# Patient Record
Sex: Male | Born: 1995 | Race: Black or African American | Hispanic: No | Marital: Single | Smoking: Former smoker
Health system: Southern US, Community
[De-identification: ages and names within clinical notes are randomized; demographics above are authoritative.]

## PROBLEM LIST (undated history)

## (undated) DIAGNOSIS — R030 Elevated blood-pressure reading, without diagnosis of hypertension: Secondary | ICD-10-CM

## (undated) DIAGNOSIS — I421 Obstructive hypertrophic cardiomyopathy: Secondary | ICD-10-CM

---
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
## 2018-11-06 ENCOUNTER — Emergency Department (HOSPITAL_COMMUNITY)
Admission: EM | Admit: 2018-11-06 | Discharge: 2018-11-06 | Disposition: A | Discharge status: Home / Self Care | Payer: Self-pay | Attending: Emergency Medicine | Admitting: Emergency Medicine

## 2018-11-06 ENCOUNTER — Other Ambulatory Visit: Payer: Self-pay

## 2018-11-06 DIAGNOSIS — R202 Paresthesia of skin: Secondary | ICD-10-CM | POA: Insufficient documentation

## 2018-11-06 DIAGNOSIS — Z880 Allergy status to penicillin: Secondary | ICD-10-CM | POA: Insufficient documentation

## 2018-11-06 NOTE — ED Notes (Signed)
Patient verbalizes understanding of discharge instructions. Opportunity for questioning and answers were provided. Armband removed by staff, pt discharged from ED.  

## 2018-11-06 NOTE — ED Provider Notes (Signed)
[facility] EMERGENCY DEPARTMENT Provider Note   CSN: [id number] Arrival date & time: 11/06/18  2135    History   Chief Complaint Chief Complaint  Patient presents with  . Tingling in left hand    HPI Joseph Weiss is a 23 y.o. male is here for evaluation of tingling to the left middle and ring fingers.  Episode lasted approximately 5 minutes.  Currently asymptomatic.  He was doing his hair when he noticed the sensation.  No interventions.  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  He is right-hand dominant.  He denies any trauma.  He denies any distal loss of sensation or strength.  He denies associated proximal arm or neck pain, chest pain.  No associated redness, warmth.     HPI  No past medical history on file.  There are no active problems to display for this patient.   ** The histories are not reviewed yet. Please review them in the "History" navigator section and refresh this SmartLink.      Home Medications    Prior to Admission medications   Not on File    Family History No family history on file.  Social History Social History   Tobacco Use  . Smoking status: Not on file  Substance Use Topics  . Alcohol use: Not on file  . Drug use: Not on file     Allergies   Penicillins   Review of Systems Review of Systems  Neurological:       Paresthesias   All other systems reviewed and are negative.    Physical Exam Updated Vital Signs BP (!) 152/85 (BP Location: Right Arm)   Pulse 80   Temp 98.4 F (36.9 C) (Oral)   Resp 17   Ht 6\' 1"  (1.854 m)   Wt 108.9 kg   SpO2 98%   BMI 31.66 kg/m   Physical Exam Constitutional:      General: He is not in acute distress.    Appearance: He is well-developed.  HENT:     Head: Normocephalic and atraumatic.     Right Ear: External ear normal.     Left Ear: External ear normal.     Nose: Nose normal.  Eyes:     General: Lids are normal.  Neck:   Musculoskeletal: No muscular tenderness.     Thyroid: No thyromegaly.     Trachea: Trachea and phonation normal.     Comments: No muscular or midline tenderness. No pain with PROM of neck. Negative Spurling's. Negative Adson's. Cardiovascular:     Rate and Rhythm: Normal rate and regular rhythm.     Comments: 2+ radial pulses bilaterally. Good cap refill to fingers bilaterally.  Pulmonary:     Effort: Pulmonary effort is normal.     Breath sounds: Normal breath sounds.  Musculoskeletal:     Cervical back: He exhibits tenderness.     Comments: Shoulders: No focal tenderness to bony prominences of the left shoulder.  Full range of motion without pain.    Skin:    General: Skin is warm and dry.     Capillary Refill: Capillary refill takes less than 2 seconds.  Neurological:     Mental Status: He is alert and oriented to person, place, and time.     Comments: Sensation to light touch, sharp/dull intact in hands bilaterally 5/5 strength with hand grip and finger abduction bilaterally No tenderness to carpal tunnel. Negative phalen's and tinel's bilaterally.  Psychiatric:        Behavior: Behavior normal.        Thought Content: Thought content normal.      ED Treatments / Results  Labs (all labs ordered are listed, but only abnormal results are displayed) Labs Reviewed - No data to display  EKG None  Radiology No results found.  Procedures Procedures (including critical care time)  Medications Ordered in ED Medications - No data to display   Initial Impression / Assessment and Plan / ED Course  I have reviewed the triage vital signs and the nursing notes.  Pertinent labs & imaging results that were available during my care of the patient were reviewed by me and considered in my medical decision making (see chart for details).         23 yo with transient resolved paresthesias to left middle/ring fingers.  Given symptoms and exam I suspect carpal tunnel syndrome  versus other peripheral radiculopathy.  Exam is reassuring.  Cap refill, strength and sensation intact.  He has no associated neck pain.  Do not think there is indication for emergent imaging or lab work today.  He does repetitive hand movements at work and when playing the piano.  I will discharge with nighttime splint, rest, ice.  Return precautions were discussed.  He is comfortable with this.  Final Clinical Impressions(s) / ED Diagnoses   Final diagnoses:  Paresthesias    ED Discharge Orders    None       Jerrell Mylar 11/06/18 2246    Jacalyn Lefevre, MD 11/06/18 [phone]

## 2018-11-06 NOTE — Discharge Instructions (Addendum)
You were seen in the ER for tingling to left middle/ring fingers.   I think this is related to your median nerve or other hand nerve.  Wear splint at night. Ice your wrist. Rest.   Return for neck pain, loss of strength or sensation to extremity

## 2018-11-06 NOTE — ED Triage Notes (Signed)
Pt c/o tingling sensation in left hand, pt noticed when he was playing with his hair tonight.

## 2018-11-07 ENCOUNTER — Emergency Department (HOSPITAL_COMMUNITY): Payer: Self-pay

## 2018-11-07 ENCOUNTER — Other Ambulatory Visit: Payer: Self-pay

## 2018-11-07 ENCOUNTER — Emergency Department (HOSPITAL_COMMUNITY)
Admission: EM | Admit: 2018-11-07 | Discharge: 2018-11-07 | Disposition: A | Discharge status: Home / Self Care | Payer: Self-pay | Attending: Emergency Medicine | Admitting: Emergency Medicine

## 2018-11-07 ENCOUNTER — Encounter (HOSPITAL_COMMUNITY): Payer: Self-pay | Admitting: Emergency Medicine

## 2018-11-07 DIAGNOSIS — R0789 Other chest pain: Secondary | ICD-10-CM | POA: Insufficient documentation

## 2018-11-07 LAB — URINALYSIS, ROUTINE W REFLEX MICROSCOPIC
Bilirubin Urine: NEGATIVE
Glucose, UA: NEGATIVE mg/dL
Hgb urine dipstick: NEGATIVE
Ketones, ur: NEGATIVE mg/dL
Leukocytes,Ua: NEGATIVE
Nitrite: NEGATIVE
PROTEIN: NEGATIVE mg/dL
Specific Gravity, Urine: 1.017 (ref 1.005–1.030)
pH: 7 (ref 5.0–8.0)

## 2018-11-07 LAB — CBC WITH DIFFERENTIAL/PLATELET
Abs Immature Granulocytes: 0.03 10*3/uL (ref 0.00–0.07)
Basophils Absolute: 0 10*3/uL (ref 0.0–0.1)
Basophils Relative: 0 %
EOS ABS: 0.1 10*3/uL (ref 0.0–0.5)
Eosinophils Relative: 2 %
HCT: 50 % (ref 39.0–52.0)
Hemoglobin: 16.1 g/dL (ref 13.0–17.0)
Immature Granulocytes: 0 %
Lymphocytes Relative: 19 %
Lymphs Abs: 1.8 10*3/uL (ref 0.7–4.0)
MCH: 28.5 pg (ref 26.0–34.0)
MCHC: 32.2 g/dL (ref 30.0–36.0)
MCV: 88.5 fL (ref 80.0–100.0)
Monocytes Absolute: 0.8 10*3/uL (ref 0.1–1.0)
Monocytes Relative: 8 %
Neutro Abs: 6.8 10*3/uL (ref 1.7–7.7)
Neutrophils Relative %: 71 %
PLATELETS: 249 10*3/uL (ref 150–400)
RBC: 5.65 MIL/uL (ref 4.22–5.81)
RDW: 12 % (ref 11.5–15.5)
WBC: 9.5 10*3/uL (ref 4.0–10.5)
nRBC: 0 % (ref 0.0–0.2)

## 2018-11-07 LAB — COMPREHENSIVE METABOLIC PANEL
ALT: 61 U/L — ABNORMAL HIGH (ref 0–44)
AST: 43 U/L — ABNORMAL HIGH (ref 15–41)
Albumin: 4.6 g/dL (ref 3.5–5.0)
Alkaline Phosphatase: 78 U/L (ref 38–126)
Anion gap: 8 (ref 5–15)
BUN: 8 mg/dL (ref 6–20)
CO2: 27 mmol/L (ref 22–32)
Calcium: 10.2 mg/dL (ref 8.9–10.3)
Chloride: 103 mmol/L (ref 98–111)
Creatinine, Ser: 1.19 mg/dL (ref 0.61–1.24)
GFR calc Af Amer: >60 mL/min (ref >60)
GFR calc non Af Amer: >60 mL/min (ref >60)
Glucose, Bld: 105 mg/dL — ABNORMAL HIGH (ref 70–99)
POTASSIUM: 3.9 mmol/L (ref 3.5–5.1)
Sodium: 138 mmol/L (ref 135–145)
Total Bilirubin: 0.8 mg/dL (ref 0.3–1.2)
Total Protein: 8.2 g/dL — ABNORMAL HIGH (ref 6.5–8.1)

## 2018-11-07 LAB — TROPONIN I

## 2018-11-07 LAB — RAPID URINE DRUG SCREEN, HOSP PERFORMED
Amphetamines: NOT DETECTED
Barbiturates: NOT DETECTED
Benzodiazepines: NOT DETECTED
Cocaine: NOT DETECTED
Opiates: NOT DETECTED
Tetrahydrocannabinol: POSITIVE — AB

## 2018-11-07 LAB — LIPASE, BLOOD: LIPASE: 23 U/L (ref 11–51)

## 2018-11-07 LAB — PROTIME-INR
INR: 1.05
Prothrombin Time: 13.6 seconds (ref 11.4–15.2)

## 2018-11-07 LAB — I-STAT TROPONIN, ED: Troponin i, poc: 0 ng/mL (ref 0.00–0.08)

## 2018-11-07 MED ORDER — ASPIRIN 81 MG PO CHEW
324.0000 mg | CHEWABLE_TABLET | Freq: Once | ORAL | Status: AC
  Administered 2018-11-07: 324 mg via ORAL
  Filled 2018-11-07: qty 4

## 2018-11-07 MED ORDER — NITROGLYCERIN 2 % TD OINT
1.0000 [in_us] | TOPICAL_OINTMENT | Freq: Four times a day (QID) | TRANSDERMAL | Status: DC
  Administered 2018-11-07: 1 [in_us] via TOPICAL
  Filled 2018-11-07: qty 1

## 2018-11-07 NOTE — ED Provider Notes (Signed)
[facility] EMERGENCY DEPARTMENT Provider Note   CSN: [id number] Arrival date & time: 11/07/18  1255    History   Chief Complaint Chief Complaint  Patient presents with  . Chest Pain    HPI Joseph Weiss is a 23 y.o. male.     HPI Patient reports that at about 1130 he developed some chest pain.  He reports really it was not even a pain but an odd sensation.  He reports that just felt like he got a warmth in the center of his chest that lasted for about 45 minutes.  He denies he had shortness of breath with it.  He did not feel nauseated or vomit.  No sweats or lightheadedness.  After about 45 minutes it is resolved.  Patient reports the only thing about it was he was seen yesterday in the emergency department any had pain and tingling numbness sensation on the left hand.  He reports it was involving his fourth and fifth finger and it was reproducible when they put him in a position that stretched his elbows and then it caused tingling in his fourth and fifth fingers.  Patient denies any history of hypertension.  He denies he is been told that he needs to take medications.  He does not have any history of diabetes or cholesterol.  He reports his mother did have a heart attack in her 43s.  He reports however she also had a lot of problems with drugs that contributed to her poor health.  No other family members with early onset cardiac disease or sudden death.  Patient reports he does smoke about a half a pack of cigarettes per day.  He denies any use of IV drugs, cocaine, stimulant drugs. History reviewed. No pertinent past medical history.  There are no active problems to display for this patient.   Denies any significant medical history.      Home Medications    Prior to Admission medications   Not on File    Family History History reviewed. No pertinent family history.  Social History Social History   Tobacco Use  . Smoking status: Not on file    Substance Use Topics  . Alcohol use: Not on file  . Drug use: Not on file     Allergies   Penicillins   Review of Systems Review of Systems 10 Systems reviewed and are negative for acute change except as noted in the HPI.  Physical Exam Updated Vital Signs BP 121/60   Pulse 77   Temp 98.8 F (37.1 C) (Oral)   Resp 20   Ht 6\' 1"  (1.854 m)   Wt 109.2 kg   SpO2 99%   BMI 31.76 kg/m   Physical Exam Constitutional:      Appearance: He is well-developed.  HENT:     Head: Normocephalic and atraumatic.  Eyes:     Pupils: Pupils are equal, round, and reactive to light.  Neck:     Musculoskeletal: Neck supple.  Cardiovascular:     Rate and Rhythm: Normal rate and regular rhythm.     Heart sounds: Normal heart sounds.  Pulmonary:     Effort: Pulmonary effort is normal.     Breath sounds: Normal breath sounds.  Abdominal:     General: Bowel sounds are normal. There is no distension.     Palpations: Abdomen is soft.     Tenderness: There is no abdominal tenderness.  Musculoskeletal: Normal range of motion.  General: No swelling or tenderness.     Right lower leg: No edema.     Left lower leg: No edema.  Skin:    General: Skin is warm and dry.  Neurological:     Mental Status: He is alert and oriented to person, place, and time.     GCS: GCS eye subscore is 4. GCS verbal subscore is 5. GCS motor subscore is 6.     Coordination: Coordination normal.      ED Treatments / Results  Labs (all labs ordered are listed, but only abnormal results are displayed) Labs Reviewed  COMPREHENSIVE METABOLIC PANEL - Abnormal; Notable for the following components:      Result Value   Glucose, Bld 105 (*)    Total Protein 8.2 (*)    AST 43 (*)    ALT 61 (*)    All other components within normal limits  RAPID URINE DRUG SCREEN, HOSP PERFORMED - Abnormal; Notable for the following components:   Tetrahydrocannabinol POSITIVE (*)    All other components within normal limits   LIPASE, BLOOD  CBC WITH DIFFERENTIAL/PLATELET  PROTIME-INR  URINALYSIS, ROUTINE W REFLEX MICROSCOPIC  TROPONIN I  I-STAT TROPONIN, ED    EKG EKG Interpretation  Date/Time:  Friday November 07 2018 13:16:19 EST Ventricular Rate:  72 PR Interval:    QRS Duration: 73 QT Interval:  356 QTC Calculation: 390 R Axis:   64 Text Interpretation:  Sinus rhythm diffuse t wave inversion. no old comparison. Confirmed by Arby BarrettePfeiffer, Advit Trethewey [id number]) on 11/07/2018 1:25:06 PM   Radiology Dg Chest 2 View  Result Date: 11/07/2018 CLINICAL DATA:  Chest tightness. EXAM: CHEST - 2 VIEW COMPARISON:  None. FINDINGS: The heart size and mediastinal contours are within normal limits. Both lungs are clear. The visualized skeletal structures are unremarkable. IMPRESSION: Normal chest x-ray. Electronically Signed   By: Rudie MeyerP.  Gallerani M.D.   On: 11/07/2018 14:16    Procedures Procedures (including critical care time)  Medications Ordered in ED Medications  nitroGLYCERIN (NITROGLYN) 2 % ointment 1 inch (1 inch Topical Given 11/07/18 1422)  aspirin chewable tablet 324 mg (324 mg Oral Given 11/07/18 1401)     Initial Impression / Assessment and Plan / ED Course  I have reviewed the triage vital signs and the nursing notes.  Pertinent labs & imaging results that were available during my care of the patient were reviewed by me and considered in my medical decision making (see chart for details).  Clinical Course as of Nov 08 1631  Fri Nov 07, 2018  1341 Consult: Reviewed to Dr. Swazil[place]Jordan.  He has reviewed EKG not STEMI pattern.  We will continue evaluation for chest pain.   [MP]    Clinical Course User Index [MP] Arby BarrettePfeiffer, Ravinder Hofland, MD       Patient presents with atypical chest pain.  He reports it completely and spontaneously resolved after about 45 minutes.  He was asymptomatic upon my evaluation.  Patient had EKG with repolarization abnormality.  I did had this reviewed by cardiology which agreed this is  repolarization abnormality no STEMI pattern.  Patient does not have PE risk factors.  No procedures, no long travel, no lower extremity swelling.  At this time, will obtain 2 sets of troponins and if negative patient is counseled on outpatient follow-up.  Final Clinical Impressions(s) / ED Diagnoses   Final diagnoses:  Atypical chest pain    ED Discharge Orders    None       Dama Hedgepeth,  Lebron Conners, MD 11/07/18 [phone]

## 2018-11-07 NOTE — ED Notes (Signed)
Patient verbalizes understanding of discharge instructions. Opportunity for questioning and answers were provided. 

## 2018-11-07 NOTE — Discharge Instructions (Signed)
1.  Take an over-the-counter stomach acid medication such as Prilosec or Nexium for the next 2 weeks. 2.  You need a follow-up appointment with a family doctor.  Use the referral number in your discharge instructions to help find one. 3.  Return to the emergency department if you have recurrence of pain or other associated concerning symptoms.

## 2018-11-07 NOTE — ED Triage Notes (Signed)
Patient states chest tightness and warmth in the center of his chest that started around 11:30 am. Patient has a history of heartburn but says it feels different.

## 2018-11-07 NOTE — ED Provider Notes (Signed)
Assumed care from Dr. Donnald Garre at 5:50 PM. Briefly, the patient is a 23 y.o. male with PMHx of  has no past medical history on file. here with atypical CP. EKG with LVH, no ischemic changes. Plan for delta trop, d/c if neg.   Labs Reviewed  COMPREHENSIVE METABOLIC PANEL - Abnormal; Notable for the following components:      Result Value   Glucose, Bld 105 (*)    Total Protein 8.2 (*)    AST 43 (*)    ALT 61 (*)    All other components within normal limits  RAPID URINE DRUG SCREEN, HOSP PERFORMED - Abnormal; Notable for the following components:   Tetrahydrocannabinol POSITIVE (*)    All other components within normal limits  LIPASE, BLOOD  CBC WITH DIFFERENTIAL/PLATELET  PROTIME-INR  URINALYSIS, ROUTINE W REFLEX MICROSCOPIC  TROPONIN I  I-STAT TROPONIN, ED    Course of Care: Delta trop neg. Pt is asymptomatic here. Plan for outpt follow-up per Dr. Donnald Garre.     Shaune Pollack, MD 11/07/18 1750

## 2018-11-10 ENCOUNTER — Emergency Department (HOSPITAL_COMMUNITY)
Admission: EM | Admit: 2018-11-10 | Discharge: 2018-11-11 | Disposition: A | Discharge status: Home / Self Care | Payer: Self-pay | Attending: Emergency Medicine | Admitting: Emergency Medicine

## 2018-11-10 ENCOUNTER — Other Ambulatory Visit: Payer: Self-pay

## 2018-11-10 ENCOUNTER — Emergency Department (HOSPITAL_COMMUNITY): Payer: Self-pay

## 2018-11-10 ENCOUNTER — Encounter (HOSPITAL_COMMUNITY): Payer: Self-pay | Admitting: Emergency Medicine

## 2018-11-10 ENCOUNTER — Encounter (HOSPITAL_COMMUNITY): Payer: Self-pay

## 2018-11-10 ENCOUNTER — Emergency Department (HOSPITAL_COMMUNITY)
Admission: EM | Admit: 2018-11-10 | Discharge: 2018-11-10 | Disposition: A | Discharge status: Home / Self Care | Payer: Self-pay | Attending: Emergency Medicine | Admitting: Emergency Medicine

## 2018-11-10 DIAGNOSIS — Z87891 Personal history of nicotine dependence: Secondary | ICD-10-CM | POA: Insufficient documentation

## 2018-11-10 DIAGNOSIS — R0602 Shortness of breath: Secondary | ICD-10-CM | POA: Insufficient documentation

## 2018-11-10 DIAGNOSIS — R0789 Other chest pain: Secondary | ICD-10-CM | POA: Insufficient documentation

## 2018-11-10 DIAGNOSIS — M546 Pain in thoracic spine: Secondary | ICD-10-CM | POA: Insufficient documentation

## 2018-11-10 DIAGNOSIS — Z8241 Family history of sudden cardiac death: Secondary | ICD-10-CM | POA: Insufficient documentation

## 2018-11-10 LAB — I-STAT TROPONIN, ED: Troponin i, poc: 0 ng/mL (ref 0.00–0.08)

## 2018-11-10 LAB — CBC
HCT: 46.1 % (ref 39.0–52.0)
HEMOGLOBIN: 14.9 g/dL (ref 13.0–17.0)
MCH: 28.6 pg (ref 26.0–34.0)
MCHC: 32.3 g/dL (ref 30.0–36.0)
MCV: 88.5 fL (ref 80.0–100.0)
Platelets: 250 10*3/uL (ref 150–400)
RBC: 5.21 MIL/uL (ref 4.22–5.81)
RDW: 12 % (ref 11.5–15.5)
WBC: 10.8 10*3/uL — ABNORMAL HIGH (ref 4.0–10.5)
nRBC: 0 % (ref 0.0–0.2)

## 2018-11-10 MED ORDER — METHOCARBAMOL 500 MG PO TABS: 500.0000 mg | ORAL_TABLET | Freq: Two times a day (BID) | ORAL | 0 refills | Status: DC

## 2018-11-10 MED ORDER — SODIUM CHLORIDE 0.9% FLUSH: 3.0000 mL | Freq: Once | INTRAVENOUS | Status: DC

## 2018-11-10 MED ORDER — IBUPROFEN 400 MG PO TABS
600.0000 mg | ORAL_TABLET | Freq: Once | ORAL | Status: AC
  Administered 2018-11-10: 600 mg via ORAL
  Filled 2018-11-10: qty 1

## 2018-11-10 NOTE — ED Triage Notes (Signed)
Pt here for reevaluation for on and off chest pain.  States he has been seen here for chest tightness prior.  States he wants to have an echocardiogram done.  No increase in pain but pain does persist even when taking pain medication.

## 2018-11-10 NOTE — ED Provider Notes (Signed)
[facility] EMERGENCY DEPARTMENT Provider Note   CSN: [id number] Arrival date & time: 11/10/18  0737    History   Chief Complaint Chief Complaint  Patient presents with  . Back Pain    HPI Joseph Weiss is a 23 y.o. male.     HPI   23 year old male presents today with complaints of left-sided back pain.  Patient notes symptoms started yesterday with a vague pain in his left lateral thoracic back.  He notes waking up with more pain this morning.  He notes this is worse when he moves his upper torso.  Occasionally worse with inspiration, no shortness of breath history DVT or any other significant risk factors.  No trauma to his back.  No medications prior to arrival.  No fever distal neurological deficits.  History reviewed. No pertinent past medical history.  There are no active problems to display for this patient.   History reviewed. No pertinent surgical history.      Home Medications    Prior to Admission medications   Not on File    Family History No family history on file.  Social History Social History   Tobacco Use  . Smoking status: Not on file  Substance Use Topics  . Alcohol use: Not on file  . Drug use: Not on file     Allergies   Penicillins   Review of Systems Review of Systems  All other systems reviewed and are negative.    Physical Exam Updated Vital Signs BP (!) 143/78 (BP Location: Left Arm)   Pulse 90   Temp 98.1 F (36.7 C) (Oral)   Resp 20   Ht 6\' 1"  (1.854 m)   Wt 106.6 kg   SpO2 100%   BMI 31.00 kg/m   Physical Exam Vitals signs and nursing note reviewed.  Constitutional:      Appearance: He is well-developed.  HENT:     Head: Normocephalic and atraumatic.  Eyes:     General: No scleral icterus.       Right eye: No discharge.        Left eye: No discharge.     Conjunctiva/sclera: Conjunctivae normal.     Pupils: Pupils are equal, round, and reactive to light.  Neck:   Musculoskeletal: Normal range of motion.     Vascular: No JVD.     Trachea: No tracheal deviation.  Pulmonary:     Effort: No respiratory distress.     Breath sounds: Normal breath sounds. No stridor. No wheezing, rhonchi or rales.  Musculoskeletal:     Comments: Minimal tenderness palpation of the left lateral thoracic musculature, no rashes, no midline tenderness  Neurological:     Mental Status: He is alert and oriented to person, place, and time.     Coordination: Coordination normal.  Psychiatric:        Behavior: Behavior normal.        Thought Content: Thought content normal.        Judgment: Judgment normal.      ED Treatments / Results  Labs (all labs ordered are listed, but only abnormal results are displayed) Labs Reviewed - No data to display  EKG None  Radiology No results found.  Procedures Procedures (including critical care time)  Medications Ordered in ED Medications - No data to display   Initial Impression / Assessment and Plan / ED Course  I have reviewed the triage vital signs and the nursing notes.  Pertinent labs & imaging  results that were available during my care of the patient were reviewed by me and considered in my medical decision making (see chart for details).        23 year old male presents today with likely musculoskeletal pain.  Discharged with symptomatic care strict return precautions.  Verbalized understanding and agreement to today's plan.  Very low suspicion for any pulmonary etiology.  Final Clinical Impressions(s) / ED Diagnoses   Final diagnoses:  Acute left-sided thoracic back pain    ED Discharge Orders    None       Rosalio Loud 11/10/18 5631    Benjiman Core, MD 11/10/18 [phone]

## 2018-11-10 NOTE — Discharge Instructions (Addendum)
Please read attached information. If you experience any new or worsening signs or symptoms please return to the emergency room for evaluation. Please follow-up with your primary care provider or specialist as discussed. Please use medication prescribed only as directed and discontinue taking if you have any concerning signs or symptoms.   °

## 2018-11-10 NOTE — ED Triage Notes (Signed)
PT reports left back pain beginning last night which radiates around his left side. Pt denies any falls.

## 2018-11-11 LAB — BASIC METABOLIC PANEL
Anion gap: 9 (ref 5–15)
BUN: 10 mg/dL (ref 6–20)
CO2: 26 mmol/L (ref 22–32)
Calcium: 9.4 mg/dL (ref 8.9–10.3)
Chloride: 104 mmol/L (ref 98–111)
Creatinine, Ser: 1.09 mg/dL (ref 0.61–1.24)
GFR calc Af Amer: >60 mL/min (ref >60)
GLUCOSE: 91 mg/dL (ref 70–99)
Potassium: 4.4 mmol/L (ref 3.5–5.1)
Sodium: 139 mmol/L (ref 135–145)

## 2018-11-11 LAB — HEPATIC FUNCTION PANEL
ALT: 41 U/L (ref 0–44)
AST: 32 U/L (ref 15–41)
Albumin: 4.1 g/dL (ref 3.5–5.0)
Alkaline Phosphatase: 72 U/L (ref 38–126)
BILIRUBIN DIRECT: 0.1 mg/dL (ref 0.0–0.2)
BILIRUBIN TOTAL: 0.6 mg/dL (ref 0.3–1.2)
Indirect Bilirubin: 0.5 mg/dL (ref 0.3–0.9)
Total Protein: 7.5 g/dL (ref 6.5–8.1)

## 2018-11-11 LAB — D-DIMER, QUANTITATIVE: D-Dimer, Quant: <0.27 ug/mL-FEU (ref 0.00–0.50)

## 2018-11-11 LAB — I-STAT TROPONIN, ED: Troponin i, poc: 0 ng/mL (ref 0.00–0.08)

## 2018-11-11 LAB — LIPASE, BLOOD: Lipase: 30 U/L (ref 11–51)

## 2018-11-11 NOTE — ED Notes (Signed)
E-signature not available, verbalized understanding of DC instructions and followup care with cardiology.

## 2018-11-11 NOTE — Discharge Instructions (Addendum)
There is no evidence of heart attack or blood clot in the lung.  Establish care with a primary care doctor see you can have an echocardiogram.  Stop smoking.  Return to the ED if your chest pain becomes exertional, associated with shortness of breath, sweating, nausea, vomiting or other concerns.

## 2018-11-11 NOTE — ED Provider Notes (Signed)
[facility] EMERGENCY DEPARTMENT Provider Note   CSN: [id number] Arrival date & time: 11/10/18  2303    History   Chief Complaint Chief Complaint  Patient presents with  . Chest Pain    HPI Joseph Weiss is a 23 y.o. male.     Patient presents with pain in his center of his chest that came on while he was resting earlier this evening in the car.  He stated he began to feel a tightness rated up to his neck that was associated with a feeling of anxiousness and thinks he was little bit sweaty.  There is some associated shortness of breath.  No nausea, vomiting, syncope or abdominal pain. Patient's chest pain has since resolved and he estimates it lasts only for a couple minutes.  There is no abdominal pain, nausea or vomiting.  Patient was seen earlier in the ED for back spasms and given Robaxin.  He reports the back spasms were not happening at the time of his chest pain.  He was seen in the ED in February 21 for similar chest pain and found to have early repolarization on his EKG with a negative delta troponin. Patient states the pain he had tonight is similar to the pain he experienced on February 21.  States he has not experienced any episodes of chest pain from that until now.  He admits to marijuana and tobacco use but no cocaine use.  States his mother had a heart attack in her 16s that was fatal but she also had problems with drugs. Patient does not have a doctor and does not take any chronic medications.  The history is provided by the patient and a friend.  Chest Pain  Associated symptoms: shortness of breath   Associated symptoms: no abdominal pain, no dizziness, no headache, no nausea, no vomiting and no weakness     History reviewed. No pertinent past medical history.  There are no active problems to display for this patient.   History reviewed. No pertinent surgical history.      Home Medications    Prior to Admission medications     Medication Sig Start Date End Date Taking? Authorizing Provider  methocarbamol (ROBAXIN) 500 MG tablet Take 1 tablet (500 mg total) by mouth 2 (two) times daily. 11/10/18   Eyvonne Mechanic, PA-C    Family History History reviewed. No pertinent family history.  Social History Social History   Tobacco Use  . Smoking status: Former Games developer  . Smokeless tobacco: Never Used  Substance Use Topics  . Alcohol use: Yes  . Drug use: Not on file     Allergies   Penicillins   Review of Systems Review of Systems  Constitutional: Negative for activity change and appetite change.  HENT: Negative for congestion and rhinorrhea.   Eyes: Negative for visual disturbance.  Respiratory: Positive for chest tightness and shortness of breath.   Cardiovascular: Positive for chest pain.  Gastrointestinal: Negative for abdominal pain, nausea and vomiting.  Genitourinary: Negative for dysuria and hematuria.  Musculoskeletal: Negative for arthralgias and myalgias.  Skin: Negative for rash.  Neurological: Negative for dizziness, weakness and headaches.    all other systems are negative except as noted in the HPI and PMH.    Physical Exam Updated Vital Signs BP (!) 150/84   Pulse 80   Temp 98.3 F (36.8 C) (Oral)   Resp 18   SpO2 99%   Physical Exam Vitals signs and nursing note reviewed.  Constitutional:  General: He is not in acute distress.    Appearance: He is well-developed.     Comments: Mildly anxious appearing  HENT:     Head: Normocephalic and atraumatic.     Mouth/Throat:     Pharynx: No oropharyngeal exudate.  Eyes:     Conjunctiva/sclera: Conjunctivae normal.     Pupils: Pupils are equal, round, and reactive to light.  Neck:     Musculoskeletal: Normal range of motion and neck supple.     Comments: No meningismus. Cardiovascular:     Rate and Rhythm: Normal rate and regular rhythm.     Heart sounds: Normal heart sounds. No murmur.  Pulmonary:     Effort: Pulmonary  effort is normal. No respiratory distress.     Breath sounds: Normal breath sounds.  Chest:     Chest wall: No tenderness.  Abdominal:     Palpations: Abdomen is soft.     Tenderness: There is no abdominal tenderness. There is no guarding or rebound.  Musculoskeletal: Normal range of motion.        General: No tenderness.  Skin:    General: Skin is warm.     Capillary Refill: Capillary refill takes less than 2 seconds.  Neurological:     General: No focal deficit present.     Mental Status: He is alert and oriented to person, place, and time.     Cranial Nerves: No cranial nerve deficit.     Motor: No abnormal muscle tone.     Coordination: Coordination normal.     Comments: No ataxia on finger to nose bilaterally. No pronator drift. 5/5 strength throughout. CN 2-12 intact.Equal grip strength. Sensation intact.   Psychiatric:        Behavior: Behavior normal.      ED Treatments / Results  Labs (all labs ordered are listed, but only abnormal results are displayed) Labs Reviewed  CBC - Abnormal; Notable for the following components:      Result Value   WBC 10.8 (*)    All other components within normal limits  BASIC METABOLIC PANEL  D-DIMER, QUANTITATIVE (NOT AT [facility])  HEPATIC FUNCTION PANEL  LIPASE, BLOOD  I-STAT TROPONIN, ED  I-STAT TROPONIN, ED    EKG EKG Interpretation  Date/Time:  Monday November 10 2018 23:08:47 EST Ventricular Rate:  67 PR Interval:  150 QRS Duration: 92 QT Interval:  366 QTC Calculation: 386 R Axis:   68 Text Interpretation:  Normal sinus rhythm Nonspecific ST and T wave abnormality Abnormal ECG T wave changes less pronounced than previous Confirmed by Glynn Octave [phone]) on 11/11/2018 2:20:31 AM   Radiology Dg Chest 2 View  Result Date: 11/10/2018 CLINICAL DATA:  Chest pain EXAM: CHEST - 2 VIEW COMPARISON:  11/07/2018 FINDINGS: Heart and mediastinal contours are within normal limits. No focal opacities or effusions. No acute bony  abnormality. IMPRESSION: No active cardiopulmonary disease. Electronically Signed   By: Charlett Nose M.D.   On: 11/10/2018 23:46    Procedures Procedures (including critical care time)  Medications Ordered in ED Medications  sodium chloride flush (NS) 0.9 % injection 3 mL (has no administration in time range)     Initial Impression / Assessment and Plan / ED Course  I have reviewed the triage vital signs and the nursing notes.  Pertinent labs & imaging results that were available during my care of the patient were reviewed by me and considered in my medical decision making (see chart for details).  Episode of chest tightness that has since resolved.  Similar to episode he experienced earlier this week.  His EKG shows unchanged T wave inversions without acute ischemia.  Patient states his chest pain has resolved.  Declines pain medication. Troponin negative, chest x-ray negative.  Low suspicion for ACS. D-dimer is negative and second troponin is negative as well.  Doubt ACS, pulmonary embolism, aortic dissection. Equal upper extremity blood pressures, pulses and grip strengths.  Patient to follow-up with PCP as well as cardiology for echocardiogram given his EKG abnormalities.  Return precautions discussed including if pain becomes exertional, associated shortness of breath, nausea, vomiting, diaphoresis or other concerns.  Final Clinical Impressions(s) / ED Diagnoses   Final diagnoses:  Atypical chest pain    ED Discharge Orders    None       Dominick Morella, Jeannett Senior, MD 11/11/18 [phone]

## 2019-03-09 ENCOUNTER — Telehealth: Payer: Self-pay | Admitting: Internal Medicine

## 2019-03-09 NOTE — Telephone Encounter (Signed)
No mychart, smartphone, consent, pre reg complete 03/09/19 AF °

## 2019-03-09 NOTE — Telephone Encounter (Signed)
Follow up ° ° °Patient's wife is returning call. Please call. °

## 2019-03-12 NOTE — Telephone Encounter (Signed)
I called pt, there was no answer and no voicemail. °

## 2019-03-13 ENCOUNTER — Telehealth (INDEPENDENT_AMBULATORY_CARE_PROVIDER_SITE_OTHER): Payer: Self-pay | Admitting: Internal Medicine

## 2019-03-13 VITALS — Ht 73.0 in

## 2019-03-13 DIAGNOSIS — R9431 Abnormal electrocardiogram [ECG] [EKG]: Secondary | ICD-10-CM

## 2019-03-13 DIAGNOSIS — R079 Chest pain, unspecified: Secondary | ICD-10-CM

## 2019-03-13 DIAGNOSIS — Z01818 Encounter for other preprocedural examination: Secondary | ICD-10-CM

## 2019-03-13 MED ORDER — METOPROLOL TARTRATE 50 MG PO TABS: 100.0000 mg | ORAL_TABLET | Freq: Once | ORAL | 0 refills | Status: DC

## 2019-03-13 NOTE — Patient Instructions (Addendum)
Medication Instructions:   SEE INSTRUCTION FOR  CT CORONARY ANGIOGRAM   If you need a refill on your cardiac medications before your next appointment, please call your pharmacy.   Lab work:  SEE INSTRUCTION FOR  CT CORONARY ANGIOGRAM   If you have labs (blood work) drawn today and your tests are completely normal, you will receive your results only by: Marland Kitchen. MyChart Message (if you have MyChart) OR . A paper copy in the mail If you have any lab test that is abnormal or we need to change your treatment, we will call you to review the results.  Testing/Procedures: WILL BE SCHEDULE AT [address] Your physician has requested that you have an echocardiogram. Echocardiography is a painless test that uses sound waves to create images of your heart. It provides your doctor with information about the size and shape of your heart and how well your heart's chambers and valves are working. This procedure takes approximately one hour. There are no restrictions for this procedure.   WILL BE SCHEDULE AT [place] , ONCE PRIOR AUTHORIZATION IS OBTAIN FROM INSURANCE- Your physician has requested that you have cardiac CTA. Cardiac computed tomography (CT) is a painless test that uses an x-ray machine to take clear, detailed pictures of your heart. For further information please visit [email or web address]. Please follow instruction sheet as given.    Follow-Up: At [facility], you and your health needs are our priority.  As part of our continuing mission to provide you with exceptional heart care, we have created designated Provider Care Teams.  These Care Teams include your primary Cardiologist (physician) and Advanced Practice Providers (APPs -  Physician Assistants and Nurse Practitioners) who all work together to provide you with the care you need, when you need it. . You will need a follow up appointment in 1 months.    You may see Parke PoissonGayatri A Annali Lybrand, MD*or one of the  following Advanced Practice Providers on your designated Care Team:   . Theodore DemarkRhonda Barrett, PA-C . Joni ReiningKathryn Lawrence, DNP, ANP  Any Other Special Instructions Will Be Listed Below (If Applicable).     INSTRUCTIONS FOR  CORONARY CTA    Please [facility] the [facility] main entrance of [facility] at (30-45 minutes prior to test start time)  [facility]  [address] [place], [place] [address] [phone]  Proceed to the [facility] Radiology Department (First Floor).  Please follow these instructions carefully (unless otherwise directed):  PLEASE HAVE LABS - BMP  AT LEAST ONE WEEK PRIOR TO TEST  On the Night Before the Test: . Drink plenty of water. . Do not consume any caffeinated/decaffeinated beverages or chocolate 12 hours prior to your test. . Do not take any antihistamines 12 hours prior to your test.    On the Day of the Test: . Drink plenty of water. Do not drink any water within one hour of the test. . Do not eat any food 4 hours prior to the test. . You may take your regular medications prior to the test. . Take 100 mg of lopressor (metoprolol) two hour before the test.   After the Test: . Drink plenty of water. . After receiving IV contrast, you may experience a mild flushed feeling. This is normal. . On occasion, you may experience a mild rash up to 24 hours after the test. This is not dangerous. If this occurs, you can take Benadryl 25 mg and increase your fluid  intake. . If you experience trouble breathing, this can be serious. If it is severe call 911 IMMEDIATELY. If it is mild, please call our office.

## 2019-03-13 NOTE — Progress Notes (Signed)
Virtual Visit via Telephone Note   This visit type was conducted due to national recommendations for restrictions regarding the COVID-19 Pandemic (e.g. social distancing) in an effort to limit this patient's exposure and mitigate transmission in our community.  Due to his co-morbid illnesses, this patient is at least at moderate risk for complications without adequate follow up.  This format is felt to be most appropriate for this patient at this time.  The patient did not have access to video technology/had technical difficulties with video requiring transitioning to audio format only (telephone).  All issues noted in this document were discussed and addressed.  No physical exam could be performed with this format.  Please refer to the patient's chart for his  consent to telehealth for [facility].   Date:  03/17/2019   ID:  Joseph CoxHayward T Skeens, DOB 04/07/1996, MRN [id number]  Patient Location: Home Provider Location: Office  PCP:  Patient, No Pcp Per  Cardiologist:  Parke PoissonGayatri A Male Minish, MD  Electrophysiologist:  None   Evaluation Performed:  New Patient Evaluation  Chief Complaint:  Chest pain  History of Present Illness:    Joseph Weiss is a 23 y.o. male with no significant past medical history.   Chest pain - couple months duration, twirling hair and hand felt numb and then chest pain. Any given moment  It will occur. Will last for 20 minutes. Doesn't wake from sleep.  No palpitations with chest pain Endorses snoring.  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   The patient denies  dyspnea at rest or with exertion, palpitations, PND, orthopnea, or leg swelling. Denies syncope or presyncope. Denies dizziness or lightheadedness.   The patient does not have symptoms concerning for COVID-19 infection (fever, chills, cough, or new shortness of breath).     No past medical history on file. No past surgical history on file.   No outpatient medications have been marked as taking for the 03/13/19 encounter (Telemedicine) with Parke PoissonAcharya, Vinay Ertl A, MD.     Allergies:   Penicillins   Social History   Tobacco Use  . Smoking status: Former Games developermoker  . Smokeless tobacco: Never Used  Substance Use Topics  . Alcohol use: Yes  . Drug use: Not on file     Family Hx: The patient's mother had SCD from MI age 23 ROS:   Please see the history of present illness.     All other systems reviewed and are negative.   Prior CV studies:   The following studies were reviewed today:    Labs/Other Tests and Data Reviewed:    EKG:  No ECG reviewed.  Recent Labs: 11/10/2018: BUN 10; Creatinine, Ser 1.09; Hemoglobin 14.9; Platelets 250; Potassium 4.4; Sodium 139 11/11/2018: ALT 41   Recent Lipid Panel No results found for: CHOL, TRIG, HDL, CHOLHDL, LDLCALC, LDLDIRECT  Wt Readings from Last 3 Encounters:  11/10/18 235 lb (106.6 kg)  11/07/18 240 lb 12 oz (109.2 kg)  11/06/18 240 lb (108.9 kg)     Objective:    Vital Signs:  Ht 6\' 1"  (1.854 m)   BMI 31.00 kg/m    VITAL SIGNS:  reviewed GEN:  no acute distress  ASSESSMENT & PLAN:    1. Chest pain, moderate coronary artery risk   2. Abnormal EKG   3. Pre-op testing    Will obtain  CT coronary angiogram for chest pain and to rule out coronary anomalies. Will also obtain echocardiogram to rule out structural abnormalities.   Will follow up with patient after testing to determine if further steps required.   COVID-19 Education: The signs and symptoms of COVID-19 were discussed with the patient and how to seek care for testing (follow up with PCP or arrange E-visit).  The importance of social distancing was discussed today.  Time:   Today, I have spent 40 minutes with the patient with telehealth technology discussing the above problems.     Medication Adjustments/Labs and Tests  Ordered: Current medicines are reviewed at length with the patient today.  Concerns regarding medicines are outlined above.   Tests Ordered: Orders Placed This Encounter  Procedures  . CT CORONARY MORPH W/CTA COR W/SCORE W/CA W/CM &/OR WO/CM  . CT CORONARY FRACTIONAL FLOW RESERVE DATA PREP  . CT CORONARY FRACTIONAL FLOW RESERVE FLUID ANALYSIS  . Basic metabolic panel  . ECHOCARDIOGRAM COMPLETE    Medication Changes: Meds ordered this encounter  Medications  . metoprolol tartrate (LOPRESSOR) 50 MG tablet    Sig: Take 2 tablets (100 mg total) by mouth once for 1 dose. TAKE TWO HOUR PRIOR TO  SCHEDULE CARDIAC CTA TEST    Dispense:  2 tablet    Refill:  0    Follow Up:  Virtual Visit or In Person in 1 month(s)  Signed, Elouise Munroe, MD  03/13/2019 11:59 PM    [place]

## 2019-03-16 ENCOUNTER — Telehealth: Payer: Self-pay | Admitting: *Deleted

## 2019-03-16 NOTE — Telephone Encounter (Signed)
LEFT MESSAGE TO CALL BACK --NEED TO GO OVER INSTRUCTION FROM OFFICE APPT FROM 6/26

## 2019-03-18 ENCOUNTER — Telehealth: Payer: Self-pay | Admitting: Internal Medicine

## 2019-03-18 NOTE — Telephone Encounter (Signed)
Lvmom to call regarding ? ins and to schedule ct if self pay/saf

## 2019-03-24 ENCOUNTER — Other Ambulatory Visit: Payer: Self-pay

## 2019-03-24 ENCOUNTER — Ambulatory Visit (HOSPITAL_COMMUNITY): Payer: Self-pay | Attending: Cardiology

## 2019-03-24 DIAGNOSIS — R079 Chest pain, unspecified: Secondary | ICD-10-CM | POA: Insufficient documentation

## 2019-03-24 DIAGNOSIS — R9431 Abnormal electrocardiogram [ECG] [EKG]: Secondary | ICD-10-CM | POA: Insufficient documentation

## 2019-03-26 ENCOUNTER — Telehealth: Payer: Self-pay | Admitting: *Deleted

## 2019-03-26 DIAGNOSIS — I421 Obstructive hypertrophic cardiomyopathy: Secondary | ICD-10-CM

## 2019-03-26 NOTE — Telephone Encounter (Signed)
Left message to call patient in regards to  Echo.  NEED TO GIVE RESULTS  need to cancel CCTA , WILL SCHEDULE CARDIAC MRI

## 2019-03-26 NOTE — Telephone Encounter (Signed)
-----   Message from Elouise Munroe, MD sent at 03/25/2019  6:25 PM EDT ----- Echo shows possible increased wall thickness in the middle of left side of heart, which is creating an obstruction to flow when he strains himself. The area in question at the tip of the heart (the apex) was not well seen and the patient did not allow for use of echo microbubble contrast. I think we need to make sure he doesn't have hypertrophic cardiomyopathy specifically affecting the tip of the heart (the apex).   Please cancel CT coronary angiogram and schedule cardiac MRI to evaluate for hypertrophic cardiomyopathy, which I think will be the more high yield test. Please put in notes "query apical or reverse curve HCM, please call Dr. Margaretann Loveless for protocol."

## 2019-03-30 NOTE — Telephone Encounter (Signed)
Follow up:    Patient returning call back from last Thursday. Triage inform me to send message to you.

## 2019-03-31 NOTE — Telephone Encounter (Signed)
Spoke to patient aware of echo  Results and also cancelling CCTA AND SCHEDULING Cardiac MRI   AWARE SCHEDUELER WILL VIEN HOM ACALL ABOUT TIME A AND DATE

## 2019-03-31 NOTE — Telephone Encounter (Signed)
TEST CANCELLED , DUE CHANGING TO CARDIAC MRI PER DR Margaretann Loveless

## 2019-04-03 ENCOUNTER — Telehealth: Payer: Self-pay | Admitting: *Deleted

## 2019-04-03 ENCOUNTER — Encounter: Payer: Self-pay | Admitting: Internal Medicine

## 2019-04-03 NOTE — Telephone Encounter (Signed)
Patient aware of Cardiac MRI scheduled for Monday 04/20/19 at 11:00 am at [facility].  Will also mail information to patient.

## 2019-04-20 ENCOUNTER — Ambulatory Visit (HOSPITAL_COMMUNITY): Admission: RE | Admit: 2019-04-20 | Payer: BC Managed Care – PPO | Source: Ambulatory Visit

## 2019-05-01 ENCOUNTER — Telehealth: Payer: Self-pay | Admitting: *Deleted

## 2019-05-01 ENCOUNTER — Telehealth (HOSPITAL_COMMUNITY): Payer: Self-pay | Admitting: Emergency Medicine

## 2019-05-01 NOTE — Telephone Encounter (Signed)
Left VM on answering service stating that appt for 8/17 has been canceled due to no pre-auth on file.   Left message on voicemail with name and callback number Marchia Bond RN Navigator Cardiac [facility] [phone] Office [phone] Cell

## 2019-05-01 NOTE — Telephone Encounter (Signed)
Left message regarding Cardiac MRI scheduled for Monday 05/04/19 at 8:00 am--insurance authorization has not been received and we need to reschedule---message sent to precert and Bernestine Amass

## 2019-05-01 NOTE — Telephone Encounter (Signed)
Left message on voicemail with name and callback number Shante Archambeault RN Navigator Cardiac Imaging [facility] [phone] Office [phone] Cell  

## 2019-05-04 ENCOUNTER — Ambulatory Visit (HOSPITAL_COMMUNITY): Payer: BC Managed Care – PPO

## 2019-05-06 ENCOUNTER — Encounter: Payer: Self-pay | Admitting: *Deleted

## 2019-05-06 NOTE — Telephone Encounter (Signed)
Left message regarding Cardiac MRI appointment schedule 05/22/19 at 8:00 am--will also mail instructoin letter to patient

## 2019-05-21 ENCOUNTER — Telehealth (HOSPITAL_COMMUNITY): Payer: Self-pay | Admitting: Emergency Medicine

## 2019-05-21 NOTE — Telephone Encounter (Signed)
Reaching out to patient to offer assistance regarding upcoming cardiac imaging study; pt verbalizes understanding of appt date/time, parking situation and where to check in, and verified current allergies; name and call back number provided for further questions should they arise Holly Pring RN Navigator Cardiac Imaging [facility] [phone] office [phone] cell 

## 2019-05-22 ENCOUNTER — Ambulatory Visit (HOSPITAL_COMMUNITY)
Admission: RE | Admit: 2019-05-22 | Discharge: 2019-05-22 | Disposition: A | Discharge status: Home / Self Care | Payer: BC Managed Care – PPO | Source: Ambulatory Visit | Attending: Internal Medicine | Admitting: Internal Medicine

## 2019-05-22 ENCOUNTER — Other Ambulatory Visit: Payer: Self-pay

## 2019-05-22 DIAGNOSIS — I421 Obstructive hypertrophic cardiomyopathy: Secondary | ICD-10-CM | POA: Insufficient documentation

## 2019-05-22 MED ORDER — GADOBUTROL 1 MMOL/ML IV SOLN
10.0000 mL | Freq: Once | INTRAVENOUS | Status: AC | PRN
  Administered 2019-05-22: 10:00:00 10 mL via INTRAVENOUS

## 2019-06-01 ENCOUNTER — Other Ambulatory Visit: Payer: Self-pay

## 2019-06-01 ENCOUNTER — Ambulatory Visit (INDEPENDENT_AMBULATORY_CARE_PROVIDER_SITE_OTHER): Payer: BC Managed Care – PPO | Admitting: Cardiovascular Disease

## 2019-06-01 ENCOUNTER — Encounter: Payer: Self-pay | Admitting: Cardiovascular Disease

## 2019-06-01 VITALS — BP 152/92 | HR 82 | Ht 73.0 in | Wt 341.0 lb

## 2019-06-01 DIAGNOSIS — I422 Other hypertrophic cardiomyopathy: Secondary | ICD-10-CM | POA: Diagnosis not present

## 2019-06-01 DIAGNOSIS — R030 Elevated blood-pressure reading, without diagnosis of hypertension: Secondary | ICD-10-CM

## 2019-06-01 DIAGNOSIS — I421 Obstructive hypertrophic cardiomyopathy: Secondary | ICD-10-CM

## 2019-06-01 HISTORY — DX: Elevated blood-pressure reading, without diagnosis of hypertension: R03.0

## 2019-06-01 HISTORY — DX: Morbid (severe) obesity due to excess calories: E66.01

## 2019-06-01 HISTORY — DX: Obstructive hypertrophic cardiomyopathy: I42.1

## 2019-06-01 MED ORDER — BISOPROLOL FUMARATE 5 MG PO TABS: ORAL_TABLET | ORAL | 5 refills | Status: AC

## 2019-06-01 NOTE — Patient Instructions (Addendum)
Medication Instructions:  START BISOPROLOL 2.5 MG DAILY   If you need a refill on your cardiac medications before your next appointment, please call your pharmacy.   Lab work: NONE  If you have labs (blood work) drawn today and your tests are completely normal, you will receive your results only by: Marland Kitchen MyChart Message (if you have MyChart) OR . A paper copy in the mail If you have any lab test that is abnormal or we need to change your treatment, we will call you to review the results.  Testing/Procedures: Your physician has recommended that you wear a holter monitor. Holter monitors are medical devices that record the heart's electrical activity. Doctors most often use these monitors to diagnose arrhythmias. Arrhythmias are problems with the speed or rhythm of the heartbeat. The monitor is a small, portable device. You can wear one while you do your normal daily activities. This is usually used to diagnose what is causing palpitations/syncope (passing out). 3 DAYS   Follow-Up: Your physician recommends that you schedule a follow-up appointment in: [place]   You have been referred to [place].

## 2019-06-01 NOTE — Progress Notes (Signed)
Cardiology Office Note   Date:  06/01/2019   ID:  Joseph Weiss, DOB 11/13/1995, MRN [id number]  PCP:  Patient, No Pcp Per  Cardiologist:   Chilton Siiffany [place], MD   No chief complaint on file.    History of Present Illness: Joseph Weiss is a 23 y.o. male who presents for follow-up.  He has been experiencing intermittent chest pain for the last few months.  It happens once or twice a day.  The discomfort is not severe.  Sometimes it happens at rest but seems to be worse when he is active.  It lasts for 10 or 20 minutes.  It is associated with some mild shortness of breath but no nausea or diaphoresis.  He denies any lower extremity, orthopnea, or PND.  He sometimes feels as though his heart skips beats.  This last for a second or 2.  He has no history of syncope or presyncope.  Of note, his mother died suddenly at age 23.  He relates she had a heart attack but he was only 9 and does not know the details.  He notes that she did have a drug problem at the time.   Joseph Weiss initially saw Dr. Priscille KluverAchyarya 02/2019 for chest pain.  His symptoms were atypical.  However EKG showed inferolateral T wave inversions.  He was referred for coronary CT-a and an echo.    Echo 03/24/2019 showed LVH greater than 65%.  There was concern for mid cavitary LV gradient that increased to 81 mmHg with Valsalva.  The apex was not well visualized.  It was recommended that the CT be canceled and he go for cardiac MRI instead.  He had a cardiac MRI on 05/22/2019.  LVEF was 68% and the right ventricular function was normal.  There was no evidence of late gadolinium enhancement.  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     Past Medical History:  Diagnosis Date  . Elevated blood pressure reading 06/01/2019  . Morbid obesity (HCC) 06/01/2019  . Obstructive hypertrophic cardiomyopathy (HCC) 06/01/2019    History  reviewed. No pertinent surgical history.   Current Outpatient Medications  Medication Sig Dispense Refill  . bisoprolol (ZEBETA) 5 MG tablet TAKE 1/2 TABLET DAILY 15 tablet 5   No current facility-administered medications for this visit.     Allergies:   Penicillins    Social History:  The patient  reports that he has quit smoking. He has never used smokeless tobacco. He reports current alcohol use.   Family History:  The patient's family history includes Drug abuse in his mother; Sudden Cardiac Death in his mother.    ROS:  Please see the history of present illness.   Otherwise, review of systems are positive for none.   All other systems are reviewed and negative.    PHYSICAL EXAM: VS:  BP (!) 152/92   Pulse 82   Ht 6\' 1"  (1.854 m)   Wt (!) 341 lb (154.7 kg)   SpO2 97%   BMI 44.99 kg/m  , BMI Body mass index is 44.99 kg/m. GENERAL:  Well appearing HEENT:  Pupils equal round and reactive, fundi not visualized, oral mucosa unremarkable NECK:  No jugular venous distention, waveform within normal limits, carotid upstroke brisk and symmetric, no bruits, no thyromegaly LYMPHATICS:  No cervical adenopathy LUNGS:  Clear to auscultation bilaterally HEART:  RRR.  PMI not displaced or sustained,S1 and  S2 within normal limits, no S3, no S4, no clicks, no rubs, no murmurs ABD:  Flat, positive bowel sounds normal in frequency in pitch, no bruits, no rebound, no guarding, no midline pulsatile mass, no hepatomegaly, no splenomegaly EXT:  2 plus pulses throughout, no edema, no cyanosis no clubbing SKIN:  No rashes no nodules NEURO:  Cranial nerves II through XII grossly intact, motor grossly intact throughout PSYCH:  Cognitively intact, oriented to person place and time    EKG:  EKG is ordered today. The ekg ordered 06/01/2019 demonstrates sinus rhythm.  Rate 82 bpm.  Inferolateral T wave inversions.  Unchanged from prior.  Echo 03/24/19: IMPRESSIONS    1. The left ventricle has  hyperdynamic systolic function, with an ejection fraction of >65%. The cavity size was normal. There is moderately increased left ventricular wall thickness. Left ventricular diastolic parameters were normal. There is minimal  mid-cavity LV gradient at rest, but with valsalva, the gradient rises to 81 mmHg. No regional wall motion abnormalities. Of note, the apex was not well-visualized on this study.  2. The right ventricle has normal systolic function. The cavity was normal. There is no increase in right ventricular wall thickness.  3. No evidence of mitral valve stenosis. No mitral regurgitation.  4. The aortic valve is tricuspid. No stenosis of the aortic valve.  5. The aortic root is normal in size and structure.  6. Normal IVC size. PA systolic pressure 28 mmHg.   Cardiac MRI 05/22/19:  IMPRESSION: 1. Normal left ventricular size, with moderate concentric hypertrophy and hyperdynamic systolic function (LVEF = 68%). There are no regional wall motion abnormalities. There is no late gadolinium enhancement in the left ventricular myocardium.  2. Normal right ventricular size, thickness and systolic function (LVEF = 48%). There are no regional wall motion abnormalities.  3.  Mild mitral and trivial tricuspid regurgitation.  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 There is no late gadolinium enhancement seen in the LV myocardium. Additional genetic testing is recommended.  Recent Labs: 11/10/2018: BUN 10; Creatinine, Ser 1.09; Hemoglobin 14.9; Platelets 250; Potassium 4.4; Sodium 139 11/11/2018: ALT 41    Lipid Panel No results found for: CHOL, TRIG, HDL, CHOLHDL, VLDL, LDLCALC, LDLDIRECT    Wt Readings from Last 3 Encounters:  06/01/19 (!) 341 lb (154.7 kg)  11/10/18 235 lb (106.6 kg)  11/07/18 240 lb 12 oz (109.2 kg)      ASSESSMENT AND PLAN:  #  Hypertrophic cardiomyopathy: Joseph Weiss had evidence of hypertrophic cardiomyopathy on his echo and cardiac MRI.  He had an intra-cavitary gradient of 81 mmHg with Valsalva.  The left ventricular posterior wall measured 1.6 cm on echo.  There is no evidence of gadolinium enhancement.  His mother died suddenly in her 20s.  He was told that she had a heart attack but the specifics around this are unclear.  She had a drug problem.  He does not remember if she ever had a cardiac stent or if this was actually sudden cardiac death.  He will inquire with his family.  We will get a 24-hour ambulatory monitor to assess for ventricular arrhythmias.  We will also start bisoprolol 2.5mg  daily after he finishes the monitor.  We are not doing ETTs due to COVID-19, but we will arrange when able.  We started the discussion about ICD.  He understands that we will make a decision together once we  have the above information.  We will refer him to the genetics clinic for further evaluation.  He recently had a child.  # Elevated BP: Blood pressure was initially very elevated.  This improved somewhat on repeat.  Typically his blood pressure is not high.  He is very stressed and anxious about this new diagnosis.  Continue to monitor for now.   Current medicines are reviewed at length with the patient today.  The patient does not have concerns regarding medicines.  The following changes have been made: Start bisoprolol  Labs/ tests ordered today include:   Orders Placed This Encounter  Procedures  . Ambulatory referral to Genetics  . LONG TERM MONITOR (3-14 DAYS)  . EKG 12-Lead     Disposition:   FU with Ladine Kiper C. Oval Linsey, MD, [facility] in 1 month     Signed, Pinky Ravan C. Oval Linsey, MD, [facility]  06/01/2019 2:48 PM    [place]

## 2019-06-04 ENCOUNTER — Telehealth: Payer: Self-pay

## 2019-06-04 NOTE — Telephone Encounter (Signed)
LM with pt with brief instructions on Zio 3 day monitor. Advised pt that it will be mailed to him today and that he can call back with any questions or concerns.

## 2019-07-03 ENCOUNTER — Ambulatory Visit: Payer: BC Managed Care – PPO | Admitting: Cardiovascular Disease

## 2020-02-13 IMAGING — MR MR CARD MORPHOLOGY WO/W CM
25 of 27 series · 38 of 40 positions shown · IV contrast (gadavist)
Comparison: none

CLINICAL DATA: 23-year-old male with an echocardiogram suspicious
for hypertrophic cardiomyopathy.

EXAM:
CARDIAC MRI
TECHNIQUE: The patient was scanned on a 1.5 Tesla GE magnet. A dedicated
cardiac coil was used. Functional imaging was done using Fiesta
sequences. [DATE], and 4 chamber views were done to assess for RWMA's.
Modified Bertranb rule using a short axis stack was used to
calculate an ejection fraction on a dedicated work station using
Circle software. The patient received 8 cc of Gadavist. After 10
minutes inversion recovery sequences were used to assess for
infiltration and scar tissue.
CONTRAST:  8 cc  of Gadavist

[Series 6: bSSFP · oblique · 8.0mm · 1.61mm/px · 13 of 425 slices shown (1 of 7)]
[im 1/425]
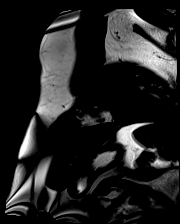
[im 36/425]
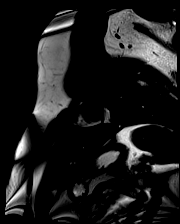
[im 71/425]
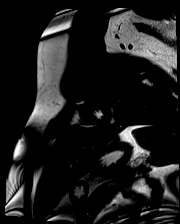
[im 107/425]
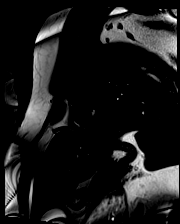
[im 142/425]
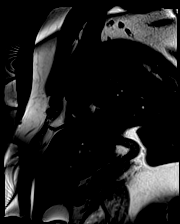
[im 177/425]
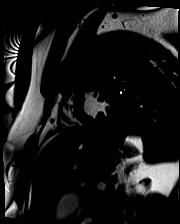
[im 213/425]
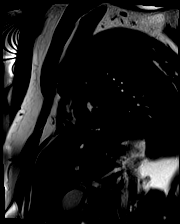
[im 248/425]
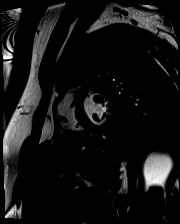
[im 283/425]
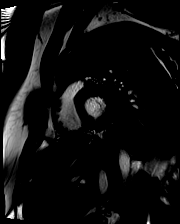
[im 319/425]
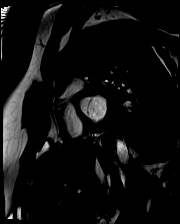
[im 354/425]
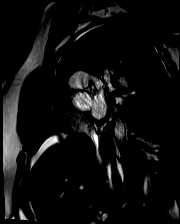
[im 389/425]
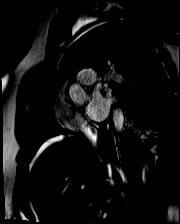
[im 425/425]
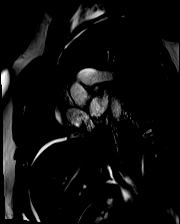

[Series 7: t2_stir_db_sax · oblique · 8.0mm · 1.73mm/px · 1 of 14 slices shown]
[im 1/14]
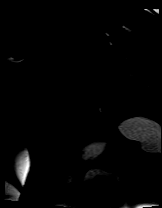

[Series 8: hla cine stack · axial · 6.0mm · 1.73mm/px · z∈[-193,-126]mm · 2 of 154 slices shown]
[im 1/154]
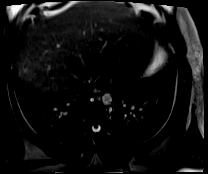
[im 154/154]
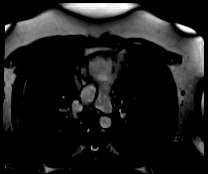

[Series 9: t2_stir_db_radial ((date)ch) · axial · 6.0mm · 1.73mm/px · 1 of 3 slices shown]
[im 1/3]
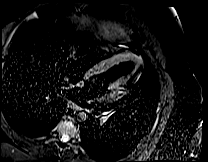

[Series 10: (id)_long_t1 · oblique · 8.0mm · 1.98mm/px · 1 of 24 slices shown (1 of 2)]
[im 1/24]
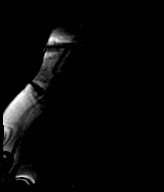

[Series 11: (id)_long_t1_moco · oblique · 8.0mm · 1.98mm/px · 1 of 24 slices shown (1 of 2)]
[im 1/24]
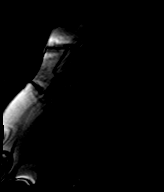

[Series 14: bSSFP · axial · 7.0mm · 1.41mm/px · 1 of 25 slices shown (2 of 7)]
[im 1/25]
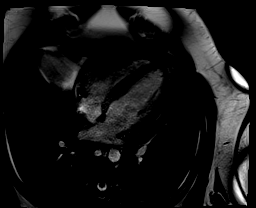

[Series 15: bSSFP · oblique · 7.0mm · 1.41mm/px · 1 of 25 slices shown (3 of 7)]
[im 1/25]
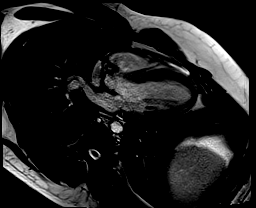

[Series 16: bSSFP · oblique · 7.0mm · 1.41mm/px · 1 of 25 slices shown (4 of 7)]
[im 1/25]
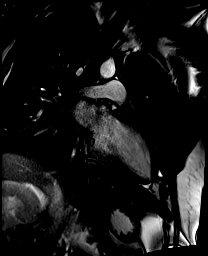

[Series 17: bSSFP · oblique · 7.0mm · 1.41mm/px · 1 of 25 slices shown (5 of 7)]
[im 1/25]
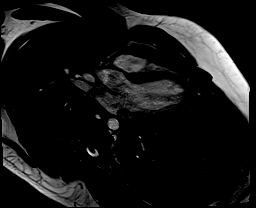

[Series 18: bSSFP · oblique · 7.0mm · 1.41mm/px · 1 of 25 slices shown (6 of 7)]
[im 1/25]
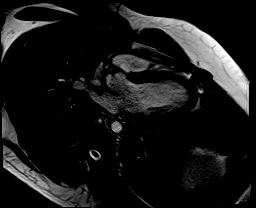

[Series 19: bSSFP · oblique · 7.0mm · 1.41mm/px · 1 of 25 slices shown (7 of 7)]
[im 1/25]
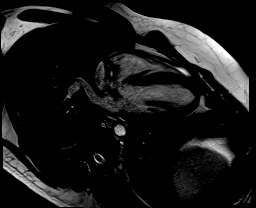

[Series 21: lge_single shot sa · oblique · 8.0mm · 1.98mm/px · 1 of 17 slices shown (1 of 2)]
[im 1/17]
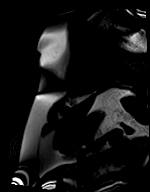

[Series 22: lge_single shot sa · oblique · 8.0mm · 1.98mm/px · 1 of 17 slices shown (2 of 2)]
[im 1/17]
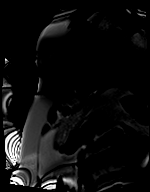

[Series 23: lge_single shot radial_mag · axial · 6.0mm · 1.98mm/px · 1 of 1 slices shown]
[im 1/1]
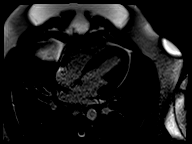

[Series 24: lge_single shot radial_psir · axial · 6.0mm · 1.98mm/px · 1 of 1 slices shown]
[im 1/1]
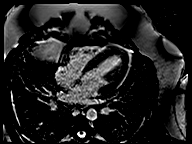

[Series 29: (id)_long_t1 · oblique · 8.0mm · 1.98mm/px · 1 of 24 slices shown (2 of 2)]
[im 1/24]
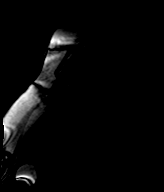

[Series 30: (id)_long_t1_moco · oblique · 8.0mm · 1.98mm/px · 1 of 24 slices shown (2 of 2)]
[im 1/24]
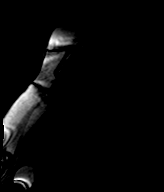

[Series 34: lge short axis_mag · oblique · 8.0mm · 1.61mm/px · 1 of 14 slices shown]
[im 1/14]
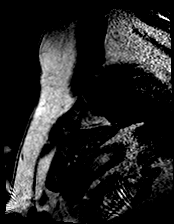

[Series 35: lge short axis_psir · oblique · 8.0mm · 1.61mm/px · 1 of 14 slices shown]
[im 1/14]
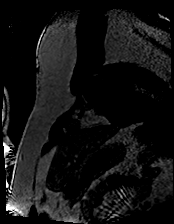

[Series 36: lge radial ((date)ch)_mag · axial · 6.0mm · 1.61mm/px · 1 of 1 slices shown]
[im 1/1]
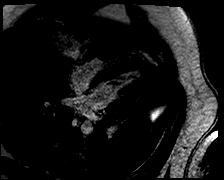

[Series 37: lge radial ((date)ch)_psir · axial · 6.0mm · 1.61mm/px · 1 of 1 slices shown]
[im 1/1]
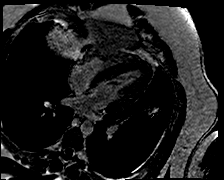

[Series 42: aorta pc flow_250_tp_retro_bh · oblique · 6.0mm · 1.73mm/px · 1 of 30 slices shown]
[im 1/30]
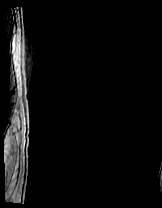

[Series 43: aorta pc flow_250_tp_retro_bh_mag · oblique · 6.0mm · 1.73mm/px · 1 of 30 slices shown]
[im 1/30]
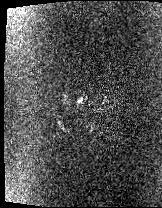

[Series 44: aorta pc flow_250_tp_retro_bh_p · oblique · 6.0mm · 1.73mm/px · 1 of 30 slices shown]
[im 1/30]
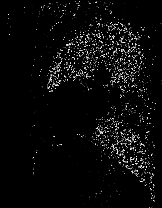

[38 of 40 positions shown; findings below may reference images not displayed]

FINDINGS: 1. Normal left ventricular size, with moderate concentric
hypertrophy and hyperdynamic systolic function (LVEF = 68%). There
are no regional wall motion abnormalities. There is no late
gadolinium enhancement in the left ventricular myocardium.

LVEDD: 49 mm

LVESD: 28 mm

LVEDV: 143 ml

LVESV: 45 ml

SV: 97 ml

CO: 10.2 L/min

Myocardial mass: 216 g

2. Normal right ventricular size, thickness and systolic function
(LVEF = 48%). There are no regional wall motion abnormalities.

3.  Normal left and right atrial size.

4. Normal size of the aortic root, ascending aorta and pulmonary
artery.

5.  Mild mitral and trivial tricuspid regurgitation.

6.  Normal pericardium.  No pericardial effusion.
IMPRESSION: 1. Normal left ventricular size, with moderate concentric
hypertrophy and hyperdynamic systolic function (LVEF = 68%). There
are no regional wall motion abnormalities. There is no late
gadolinium enhancement in the left ventricular myocardium.

2. Normal right ventricular size, thickness and systolic function
(LVEF = 48%). There are no regional wall motion abnormalities.

3.  Mild mitral and trivial tricuspid regurgitation.

These findings are suspicious for a reverse curve hypertrophic
cardiomyopathy with increased gradients in the mid cavity and near
cavity obliteration in systole. There is no late gadolinium
enhancement seen in the LV myocardium. Additional genetic testing is
recommended.

## 2021-10-26 ENCOUNTER — Encounter: Payer: Self-pay | Admitting: Neurology

## 2021-11-13 ENCOUNTER — Encounter: Payer: Self-pay | Admitting: Neurology

## 2021-11-13 ENCOUNTER — Ambulatory Visit (INDEPENDENT_AMBULATORY_CARE_PROVIDER_SITE_OTHER): Payer: 59 | Admitting: Neurology

## 2021-11-13 ENCOUNTER — Other Ambulatory Visit: Payer: 59

## 2021-11-13 ENCOUNTER — Other Ambulatory Visit: Payer: Self-pay

## 2021-11-13 VITALS — BP 151/94 | HR 76 | Resp 20 | Wt 365.0 lb

## 2021-11-13 DIAGNOSIS — R202 Paresthesia of skin: Secondary | ICD-10-CM

## 2021-11-13 NOTE — Progress Notes (Signed)
[facility] Clinic Note - Initial Visit   Date: 11/13/21  [name] MRN: [id number] DOB: 05-03-1996   Dear Joseph Bent, PA-C:  Thank you for your kind referral of Joseph Weiss for consultation of numbness/tingling of the hands and feet. Although his history is well known to you, please allow [place] to reiterate it for the purpose of our medical record. The patient was accompanied to the clinic by girlfriend who also provides collateral information.     History of Present Illness: Joseph Weiss is a 26 y.o. right-handed male with hypertrophic cardiomyopathy presenting for evaluation of generalized numbness/tingling.   Starting in the fall of 2022, he complains of whole body numbness involving the scalp, hands, feet, and legs.  Symptoms lasts about a minute and self-resolve. It tends to move around and is never in the same location.  No specific trigger or alleviating factors.  No weakness, imbalance, or falls.  TSH was normal. He also reports having one spell of feeling as if his right eyelid was heavy, but it did not droop.  He quit smoking several months ago.  He was previously drinking liquor several times per week, but stopped this a year ago.   Past Medical History:  Diagnosis Date   Elevated blood pressure reading 06/01/2019   Morbid obesity (HCC) 06/01/2019   Obstructive hypertrophic cardiomyopathy (HCC) 06/01/2019    History reviewed. No pertinent surgical history.   Medications:  Outpatient Encounter Medications as of 11/13/2021  Medication Sig   bisoprolol (ZEBETA) 5 MG tablet TAKE 1/2 TABLET DAILY   No facility-administered encounter medications on file as of 11/13/2021.    Allergies:  Allergies  Allergen Reactions   Penicillins Other (See Comments)    unknown    Family History: Family History  Problem Relation Age of Onset   Sudden Cardiac Death Mother    Drug abuse Mother     Social History: Social History    Tobacco Use   Smoking status: Former   Smokeless tobacco: Never  Substance Use Topics   Alcohol use: Not Currently   Drug use: Never   Social History   Social History Narrative   Right handed   Drinks no caffeine   One story home    Vital Signs:  BP (!) 151/94    Pulse 76    Resp 20    Wt (!) 365 lb (165.6 kg)    SpO2 97%    BMI 48.16 kg/m    Neurological Exam: MENTAL STATUS including orientation to time, place, person, recent and remote memory, attention span and concentration, language, and fund of knowledge is normal.  Speech is not dysarthric.  CRANIAL NERVES: II:  No visual field defects.    III-IV-VI: Pupils equal round and reactive to light.  Normal conjugate, extra-ocular eye movements in all directions of gaze.  No nystagmus.  No ptosis.   V:  Normal facial sensation.    VII:  Normal facial symmetry and movements.   VIII:  Normal hearing and vestibular function.   IX-X:  Normal palatal movement.   XI:  Normal shoulder shrug and head rotation.   XII:  Normal tongue strength and range of motion, no deviation or fasciculation.  MOTOR:  No atrophy, fasciculations or abnormal movements.  No pronator drift.   Upper Extremity:  Right  Left  Deltoid  5/5   5/5   Biceps  5/5   5/5   Triceps  5/5   5/5   Wrist  extensors  5/5   5/5   Wrist flexors  5/5   5/5   Finger extensors  5/5   5/5   Finger flexors  5/5   5/5   Dorsal interossei  5/5   5/5   Abductor pollicis  5/5   5/5   Tone (Ashworth scale)  0  0   Lower Extremity:  Right  Left  Hip flexors  5/5   5/5   Knee flexors  5/5   5/5   Knee extensors  5/5   5/5   Dorsiflexors  5/5   5/5   Plantarflexors  5/5   5/5   Toe extensors  5/5   5/5   Toe flexors  5/5   5/5   Tone (Ashworth scale)  0  0   MSRs:  Right        Left                  brachioradialis 2+  2+  biceps 2+  2+  triceps 2+  2+  patellar 2+  2+  ankle jerk 2+  2+  Hoffman no  no  plantar response down  down   SENSORY:  Normal and  symmetric perception of light touch, pinprick, vibration, and proprioception.  Romberg's sign absent.   COORDINATION/GAIT: Normal finger-to- nose-finger.  Intact rapid alternating movements bilaterally.    Gait narrow based and stable. Tandem and stressed gait intact.    IMPRESSION: Migratory paresthesias of the scalp, arms, and legs.  Symptoms do not follow cutaneous nerve or dermatomal distribution. Normal neurological exam is reassuring.  To be sure there is no other CNS pathology, such as demyelinating disease, recommend getting MRI brain.  My overall suspicion for primary neurological condition is very low.  Discussed that stress/anxiety can manifest with whole body symptoms.  PLAN/RECOMMENDATIONS:  Check vitamin B12, folate, vitamin B1 MRI brain wwo contrast  Further recommendations pending results.   Thank you for allowing me to participate in patient's care.  If I can answer any additional questions, I would be pleased to do so.    Sincerely,    Jonella Redditt K. Allena Katz, DO

## 2021-11-14 LAB — VITAMIN B12: Vitamin B-12: 289 pg/mL (ref 211–911)

## 2021-11-14 LAB — FOLATE: Folate: 12.3 ng/mL (ref >5.9)

## 2021-11-17 LAB — VITAMIN B1: Vitamin B1 (Thiamine): 11 nmol/L (ref 8–30)
